# Patient Record
Sex: Female | Born: 1965 | Race: White | Hispanic: No | Marital: Married | Smoking: Current every day smoker
Health system: Southern US, Community
[De-identification: ages and names within clinical notes are randomized; demographics above are authoritative.]

---

## 2005-01-30 ENCOUNTER — Ambulatory Visit: Payer: Self-pay | Admitting: General Practice

## 2005-02-19 ENCOUNTER — Ambulatory Visit: Payer: Self-pay | Admitting: General Practice

## 2005-09-04 ENCOUNTER — Emergency Department: Payer: Self-pay | Admitting: Emergency Medicine

## 2006-02-14 ENCOUNTER — Ambulatory Visit: Payer: Self-pay | Admitting: Family Medicine

## 2006-03-04 ENCOUNTER — Emergency Department: Payer: Self-pay | Admitting: Emergency Medicine

## 2006-03-05 ENCOUNTER — Ambulatory Visit: Payer: Self-pay | Admitting: Emergency Medicine

## 2006-05-29 ENCOUNTER — Ambulatory Visit: Payer: Self-pay | Admitting: Family Medicine

## 2007-10-25 IMAGING — CR LEFT INDEX FINGER 2+V
1 series · 3 of 3 positions shown · non-contrast
Comparison: none

REASON FOR EXAM: Trauma, swelling
COMMENTS:

[Series 1: view not recorded · 0.17mm/px · 3 of 3 slices shown]
[im 1/3]
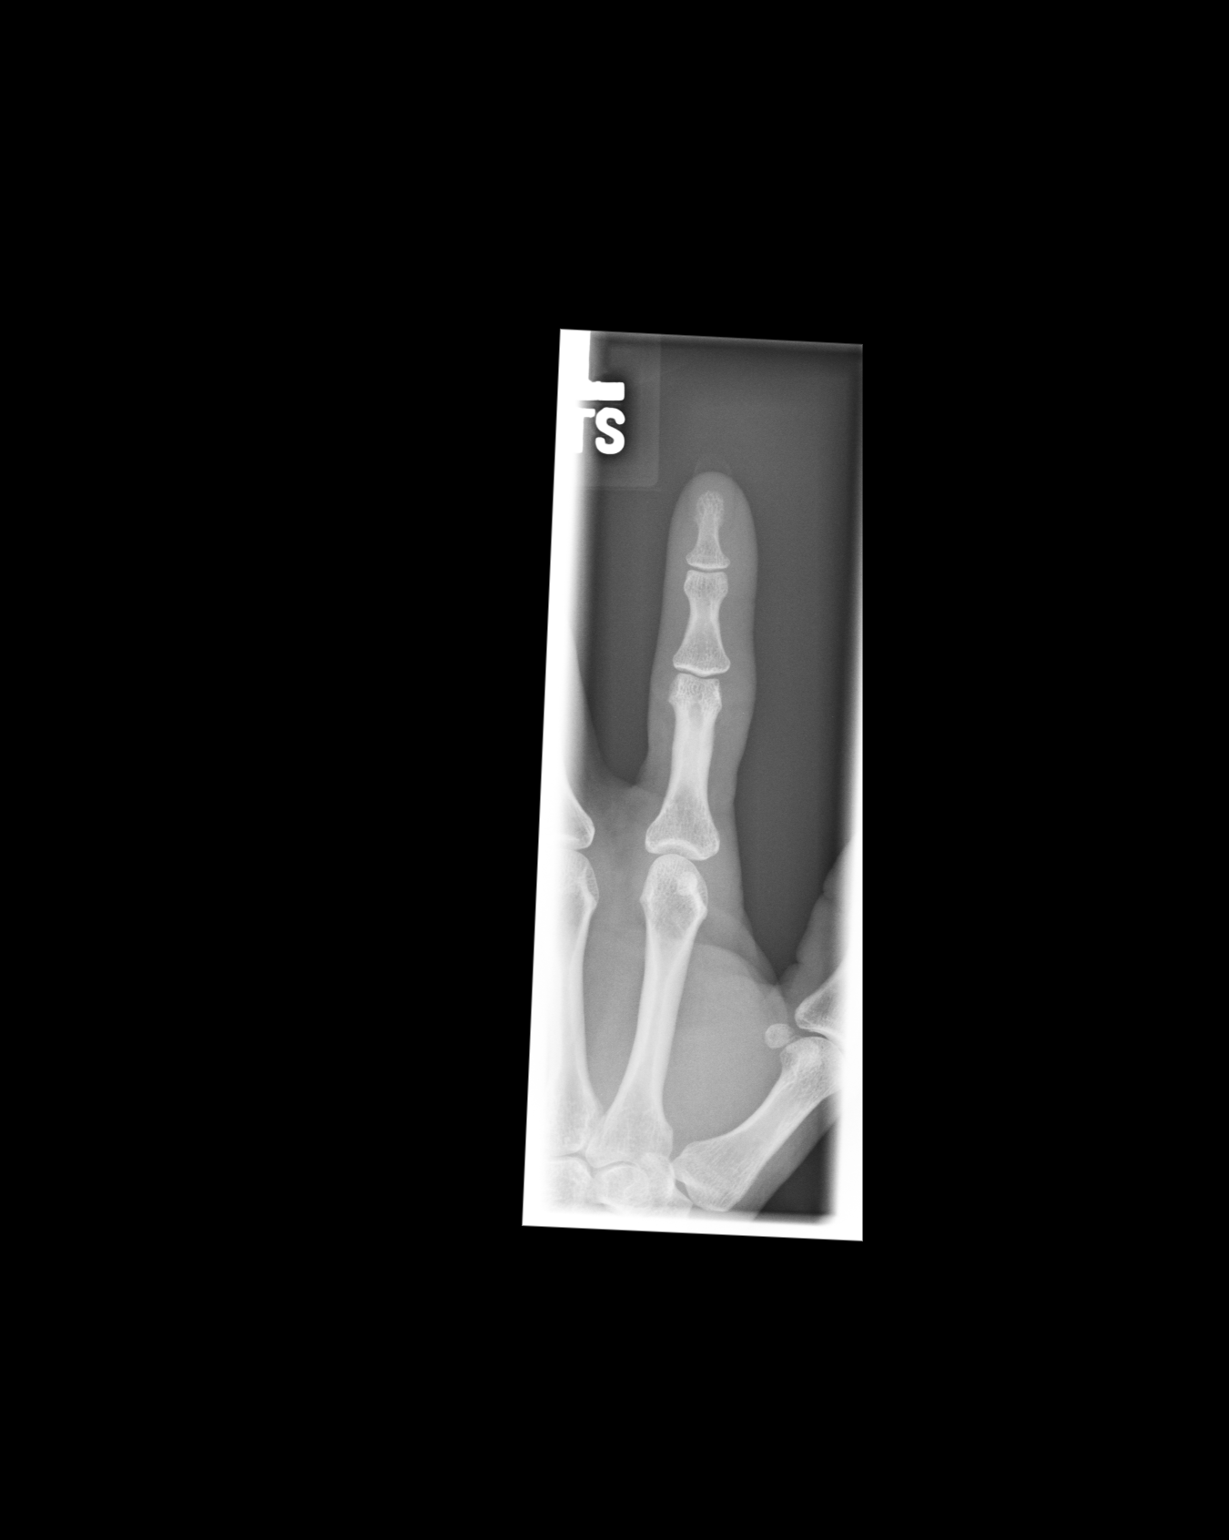
[im 2/3]
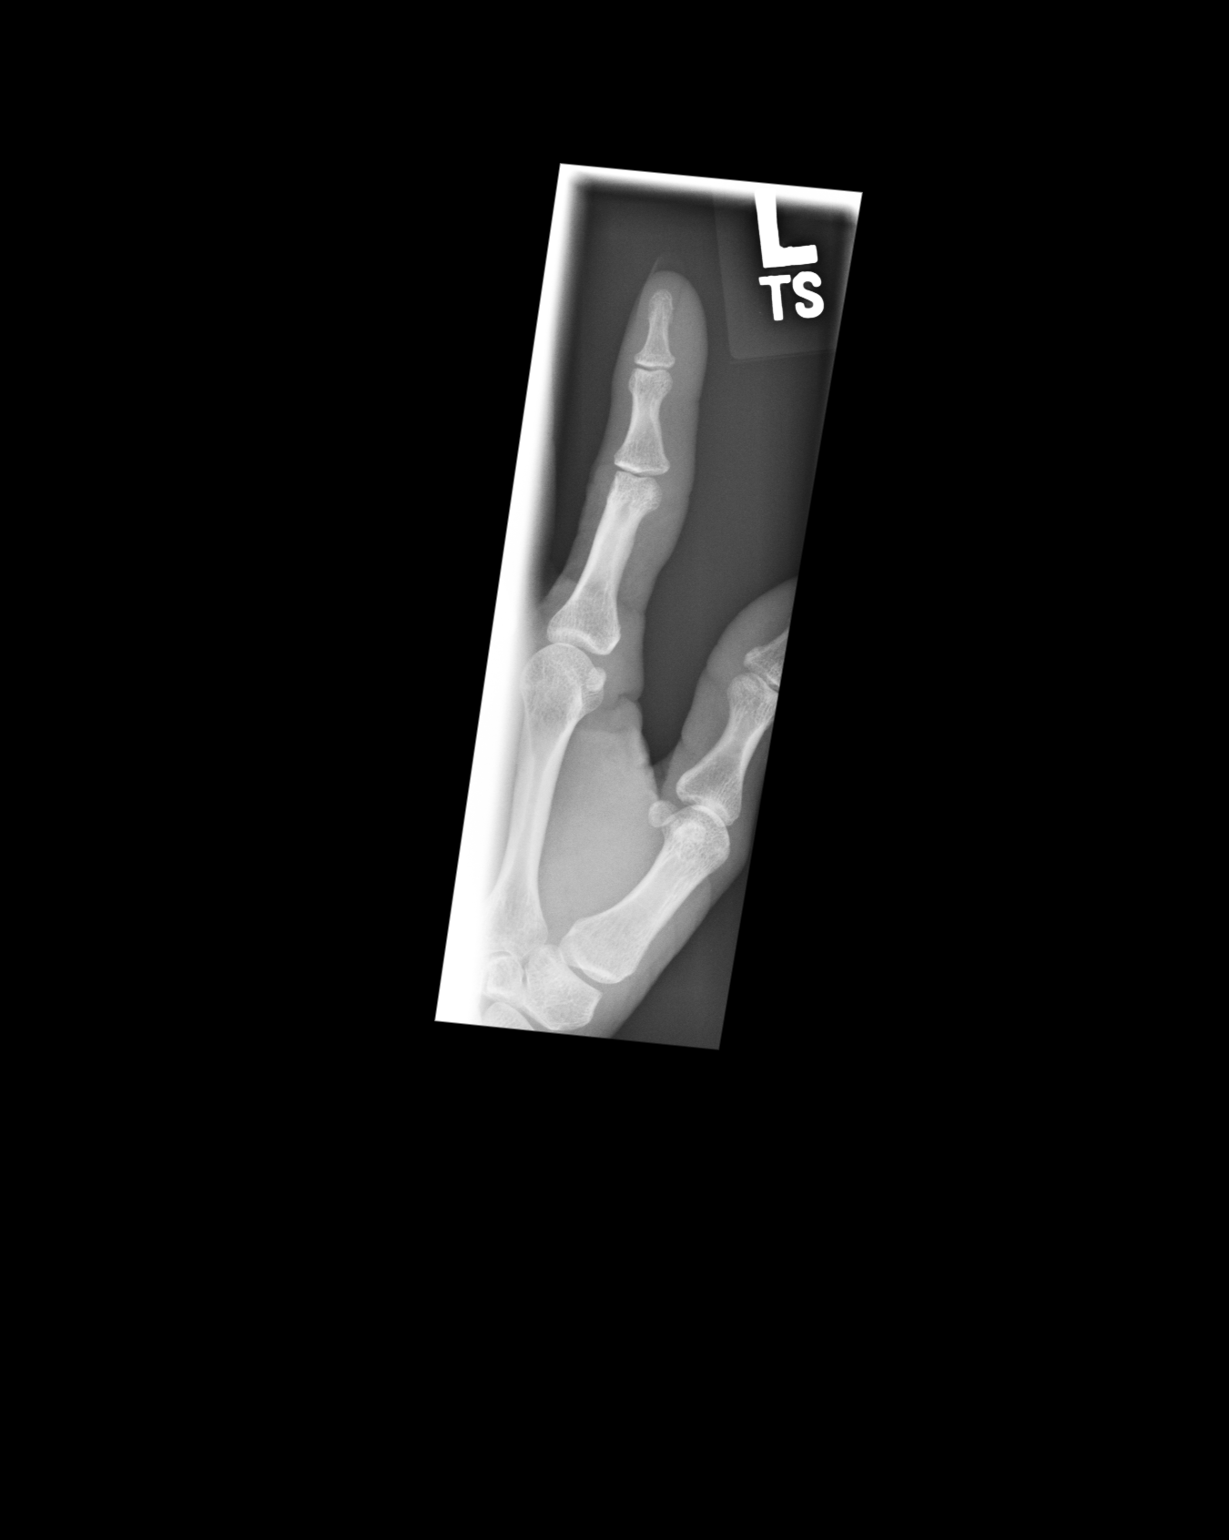
[im 3/3]
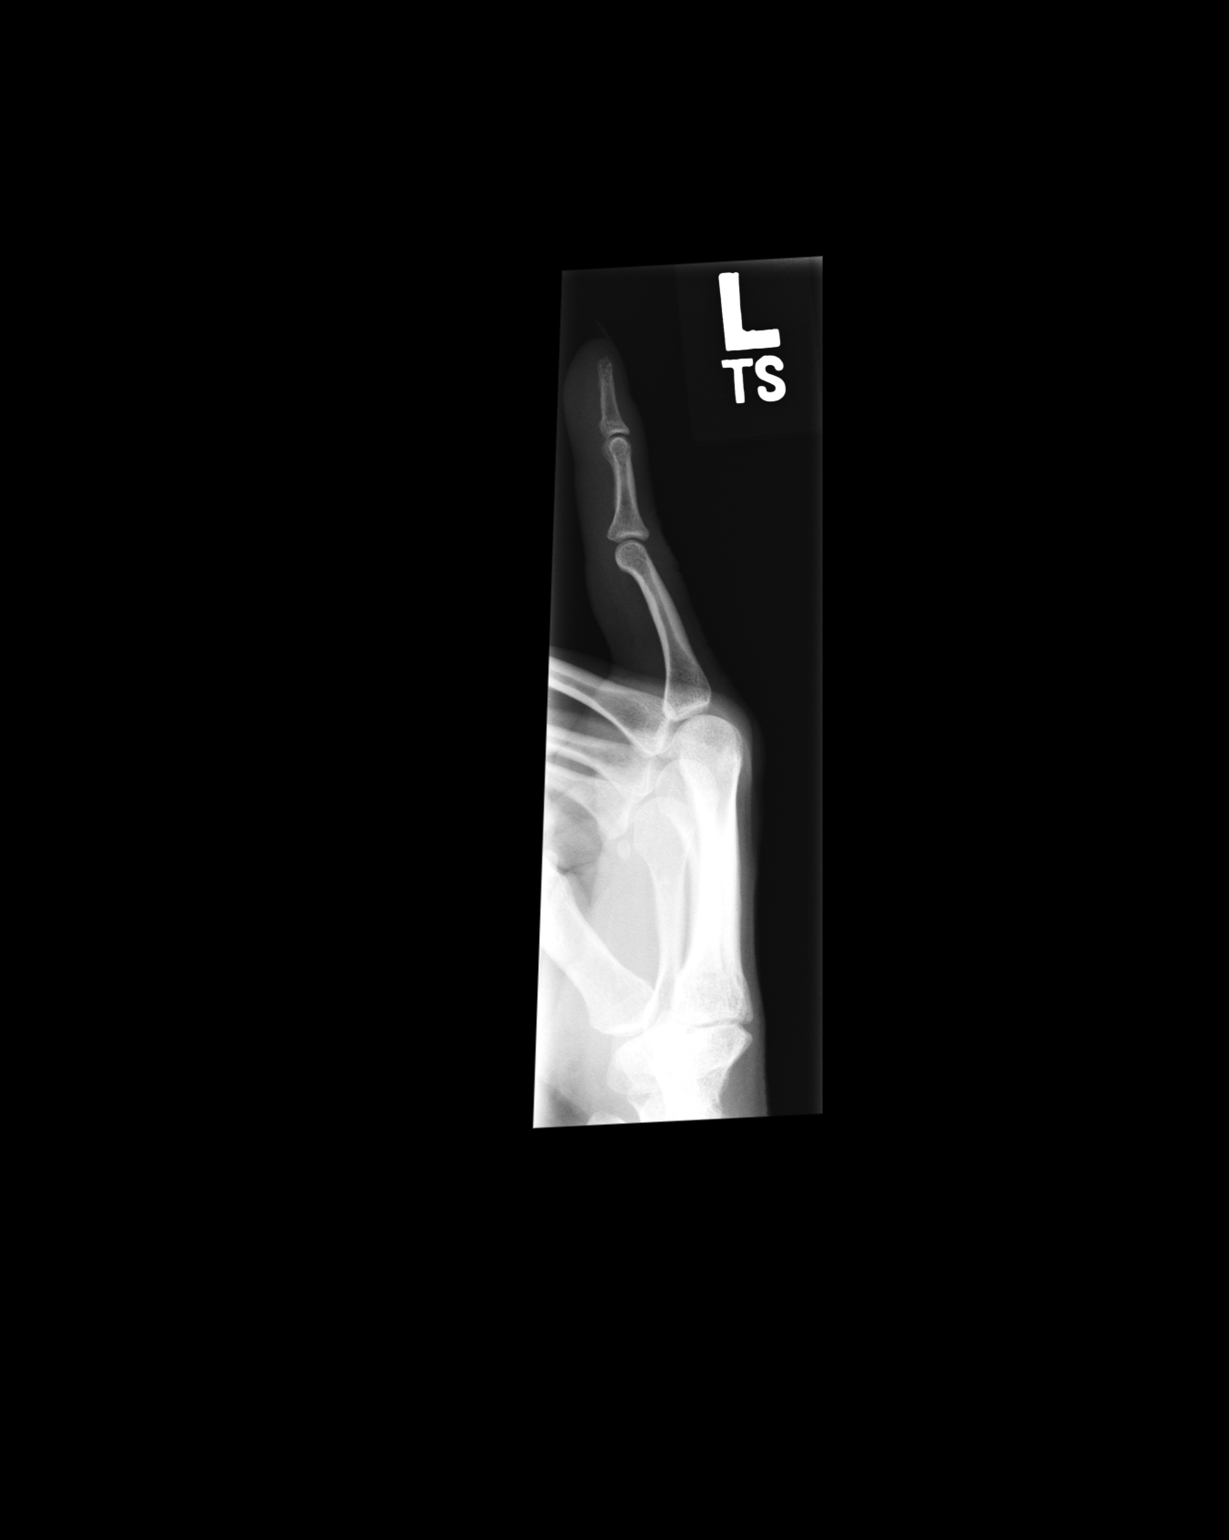

[3 of 3 positions shown; findings below may reference images not displayed]

PROCEDURE:     DXR - DXR FINGER INDEX 2ND DIGIT LT HA  - September 04, 2005 [DATE]

RESULT:     The patient sustained a crush-type injury.

There is soft tissue swelling especially over the PIP joint region.  I do
not see evidence of an acute fracture.  The interphalangeal joints are
normal in appearance.
IMPRESSION: I do not see evidence of acute bony abnormality of the LEFT index finger.
There is soft tissue swelling.

## 2010-10-17 ENCOUNTER — Emergency Department: Payer: Self-pay | Admitting: Emergency Medicine

## 2011-07-04 ENCOUNTER — Emergency Department: Payer: Self-pay | Admitting: Unknown Physician Specialty

## 2013-05-24 ENCOUNTER — Emergency Department: Payer: Self-pay

## 2014-10-07 ENCOUNTER — Emergency Department
Admission: EM | Admit: 2014-10-07 | Discharge: 2014-10-08 | Discharge status: Against Medical Advice | Payer: Self-pay | Attending: Emergency Medicine | Admitting: Emergency Medicine

## 2014-10-07 DIAGNOSIS — M545 Low back pain: Secondary | ICD-10-CM | POA: Insufficient documentation

## 2014-10-07 NOTE — ED Notes (Signed)
Pt in with left lower back  Pain since Sunday, states not better. Pain is worse when she walks or moves.

## 2020-02-10 ENCOUNTER — Ambulatory Visit (HOSPITAL_COMMUNITY)
Admission: RE | Admit: 2020-02-10 | Discharge: 2020-02-10 | Disposition: A | Discharge status: Home / Self Care | Payer: Self-pay | Source: Ambulatory Visit | Attending: Cardiology | Admitting: Cardiology

## 2020-02-10 ENCOUNTER — Other Ambulatory Visit (HOSPITAL_COMMUNITY): Payer: Self-pay | Admitting: Physician Assistant

## 2020-02-10 ENCOUNTER — Other Ambulatory Visit: Payer: Self-pay

## 2020-02-10 DIAGNOSIS — M79605 Pain in left leg: Secondary | ICD-10-CM

## 2020-02-10 NOTE — Progress Notes (Signed)
Left lower extremity venous duplex complete.  Please see CV proc tab for preliminary results. Levin Bacon- RDMS, RVT 11:38 AM  02/10/2020

## 2020-11-08 ENCOUNTER — Other Ambulatory Visit: Payer: Self-pay

## 2020-11-08 ENCOUNTER — Emergency Department
Admission: EM | Admit: 2020-11-08 | Discharge: 2020-11-08 | Disposition: A | Discharge status: Home / Self Care | Payer: Self-pay | Attending: Emergency Medicine | Admitting: Emergency Medicine

## 2020-11-08 ENCOUNTER — Emergency Department: Payer: Self-pay

## 2020-11-08 DIAGNOSIS — F17210 Nicotine dependence, cigarettes, uncomplicated: Secondary | ICD-10-CM | POA: Insufficient documentation

## 2020-11-08 DIAGNOSIS — U071 COVID-19: Secondary | ICD-10-CM | POA: Insufficient documentation

## 2020-11-08 NOTE — Discharge Instructions (Addendum)
Follow-up with your primary care provider if any continued problems or urgent care such as [facility] acute care if any continued problems.  Return to the emergency department if any severe worsening of your symptoms such as difficulty breathing or shortness of breath.  You may continue taking over-the-counter medication for your symptoms and drink lots of fluids to stay hydrated.  Discontinue smoking.

## 2020-11-08 NOTE — ED Notes (Signed)
See triage note  States she tested positive for COVID last Tuesday  conts' to have congestion and bilateral ear pain   Also has been having some SOB  Afebrile on arrival

## 2020-11-08 NOTE — ED Triage Notes (Signed)
Pt c/o cough, congestion, fever and bodyaches

## 2020-11-08 NOTE — ED Provider Notes (Signed)
[facility] Emergency Department Provider Note   ____________________________________________   Event Date/Time   First MD Initiated Contact with Patient 11/08/20 [id number]     (approximate)  I have reviewed the triage vital signs and the nursing notes.   HISTORY  Chief Complaint URI    HPI Brianna Solis is a 55 y.o. female sent to the ED with complaint of cough, congestion, fever and body aches.  Patient was exposed to her daughter who has COVID.  Patient tested COVID 7 days ago with a home test that was provided by [employer] where she works.  Patient is nonvaccinated.  Patient reports that she is a smoker.  Currently she is worried that she has pneumonia.  Currently she rates her discomfort as a 5 out of 10.       No past medical history on file.  There are no problems to display for this patient.   Prior to Admission medications   Not on File    Allergies Erythromycin  No family history on file.  Social History Social History   Tobacco Use   Smoking status: Every Day    Types: Cigarettes   Smokeless tobacco: Never  Substance Use Topics   Alcohol use: Not Currently   Drug use: Not Currently    Review of Systems Constitutional: Positive fever/chills Eyes: No visual changes. ENT: No sore throat.  Positive congestion. Cardiovascular: Denies chest pain. Respiratory: Denies shortness of breath.  Positive cough. Gastrointestinal: No abdominal pain.  No nausea, no vomiting.  No diarrhea.  No constipation. Musculoskeletal: Positive musculoskeletal pain. Skin: Negative for rash. Neurological: Negative for headaches, focal weakness or numbness. ____________________________________________   PHYSICAL EXAM:  VITAL SIGNS: ED Triage Vitals  Enc Vitals Group     BP 11/08/20 0800 119/60     Pulse Rate 11/08/20 0800 66     Resp 11/08/20 0800 18     Temp 11/08/20 0800 98.5 F (36.9 C)     Temp Source 11/08/20 0800 Oral     SpO2 11/08/20 0800  99 %     Weight 11/08/20 0801 106 lb 0.7 oz (48.1 kg)     Height 11/08/20 0801 4\' 11"  (1.499 m)     Head Circumference --      Peak Flow --      Pain Score 11/08/20 0800 5     Pain Loc --      Pain Edu? --      Excl. in [place]? --     Constitutional: Alert and oriented. Well appearing and in no acute distress. Eyes: Conjunctivae are normal.  Head: Atraumatic. Nose: No congestion/rhinnorhea. Mouth/Throat: Mucous membranes are moist.  Oropharynx non-erythematous. Neck: No stridor.   Cardiovascular: Normal rate, regular rhythm. Grossly normal heart sounds.  Good peripheral circulation. Respiratory: Normal respiratory effort.  No retractions. Lungs CTAB. Gastrointestinal: Soft and nontender. No distention.  Musculoskeletal: Moves upper and lower extremities that any difficulty.  Normal gait was noted. Neurologic:  Normal speech and language. No gross focal neurologic deficits are appreciated. No gait instability. Skin:  Skin is warm, dry and intact. No rash noted. Psychiatric: Mood and affect are normal. Speech and behavior are normal.  ____________________________________________   LABS (all labs ordered are listed, but only abnormal results are displayed)  Labs Reviewed - No data to display ____________________________________________  ____________________________________________  RADIOLOGY 11/10/20, personally viewed and evaluated these images (plain radiographs) as part of my medical decision making, as well as reviewing the  written report by the radiologist.   Official radiology report(s): DG Chest Port 1 View  Result Date: 11/08/2020 CLINICAL DATA:  Cough.  History of COVID. EXAM: PORTABLE CHEST 1 VIEW COMPARISON:  No recent. FINDINGS: Mediastinum and hilar structures normal. Heart size normal. No focal infiltrate. No pleural effusion or pneumothorax. Mild thoracic spine scoliosis. IMPRESSION: No acute cardiopulmonary disease. Electronically Signed   By: Maisie Fus   Register   On: 11/08/2020 08:42    ____________________________________________   PROCEDURES  Procedure(s) performed (including Critical Care):  Procedures   ____________________________________________   INITIAL IMPRESSION / ASSESSMENT AND PLAN / ED COURSE  As part of my medical decision making, I reviewed the following data within the electronic MEDICAL RECORD NUMBER Notes from prior ED visits and [place] Controlled Substance Database  55 year old female presents to the ED with concerns of having pneumonia.  Patient states that she tested positive for COVID 7 days ago with a home test that was given to her by [employer].  Patient works for [employer] and continues to work at home.  Today she is worried about having pneumonia.  Patient is nonvaccinated and does smoke daily.  O2 sat was 99% and patient is afebrile today.  Chest x-ray was negative for pneumonia or cardiopulmonary disease.  Patient was made aware and also that she should continue and quarantine at home as she has not been vaccinated and the recommendations from the CDC is still 10 days.  Patient states that she will comply.  A note was written for her today for work and she will continue working from home.  She is encouraged to return to the emergency department if any severe worsening of her symptoms or difficulty breathing.   ____________________________________________   FINAL CLINICAL IMPRESSION(S) / ED DIAGNOSES  Final diagnoses:  COVID-19     ED Discharge Orders     None        Note:  This document was prepared using Dragon voice recognition software and may include unintentional dictation errors.    Tommi Rumps, PA-C 11/08/20 2458    Sharyn Creamer, MD 11/08/20 [phone]

## 2022-06-17 ENCOUNTER — Other Ambulatory Visit: Payer: Self-pay

## 2022-06-17 ENCOUNTER — Emergency Department
Admission: EM | Admit: 2022-06-17 | Discharge: 2022-06-17 | Disposition: A | Discharge status: Home / Self Care | Payer: Self-pay | Attending: Emergency Medicine | Admitting: Emergency Medicine

## 2022-06-17 ENCOUNTER — Emergency Department: Payer: Self-pay

## 2022-06-17 DIAGNOSIS — J069 Acute upper respiratory infection, unspecified: Secondary | ICD-10-CM | POA: Insufficient documentation

## 2022-06-17 DIAGNOSIS — Z1152 Encounter for screening for COVID-19: Secondary | ICD-10-CM | POA: Insufficient documentation

## 2022-06-17 LAB — CBC WITH DIFFERENTIAL/PLATELET
Abs Immature Granulocytes: 0.01 10*3/uL (ref 0.00–0.07)
Basophils Absolute: 0.1 10*3/uL (ref 0.0–0.1)
Basophils Relative: 1 %
Eosinophils Absolute: 0.2 10*3/uL (ref 0.0–0.5)
Eosinophils Relative: 3 %
HCT: 40.7 % (ref 36.0–46.0)
Hemoglobin: 13.5 g/dL (ref 12.0–15.0)
Immature Granulocytes: 0 %
Lymphocytes Relative: 48 %
Lymphs Abs: 3.8 10*3/uL (ref 0.7–4.0)
MCH: 31.2 pg (ref 26.0–34.0)
MCHC: 33.2 g/dL (ref 30.0–36.0)
MCV: 94 fL (ref 80.0–100.0)
Monocytes Absolute: 0.6 10*3/uL (ref 0.1–1.0)
Monocytes Relative: 8 %
Neutro Abs: 3.2 10*3/uL (ref 1.7–7.7)
Neutrophils Relative %: 40 %
Platelets: 280 10*3/uL (ref 150–400)
RBC: 4.33 MIL/uL (ref 3.87–5.11)
RDW: 14 % (ref 11.5–15.5)
WBC: 7.9 10*3/uL (ref 4.0–10.5)
nRBC: 0 % (ref 0.0–0.2)

## 2022-06-17 LAB — RESP PANEL BY RT-PCR (RSV, FLU A&B, COVID)  RVPGX2
Influenza A by PCR: NEGATIVE
Influenza B by PCR: NEGATIVE
Resp Syncytial Virus by PCR: NEGATIVE
SARS Coronavirus 2 by RT PCR: NEGATIVE

## 2022-06-17 LAB — BASIC METABOLIC PANEL
Anion gap: 8 (ref 5–15)
BUN: 10 mg/dL (ref 6–20)
CO2: 24 mmol/L (ref 22–32)
Calcium: 9.1 mg/dL (ref 8.9–10.3)
Chloride: 105 mmol/L (ref 98–111)
Creatinine, Ser: 0.7 mg/dL (ref 0.44–1.00)
GFR, Estimated: >60 mL/min (ref >60)
Glucose, Bld: 106 mg/dL — ABNORMAL HIGH (ref 70–99)
Potassium: 3.8 mmol/L (ref 3.5–5.1)
Sodium: 137 mmol/L (ref 135–145)

## 2022-06-17 NOTE — ED Provider Notes (Signed)
   [facility] Provider Note    Event Date/Time   First MD Initiated Contact with Patient 06/17/22 1546     (approximate)  History   Chief Complaint: Nasal Congestion (Patient presents with nasal congestion, runny nose, chills, and post-nasal drip that has been going on for a few days; She also has been having dizziness with position changes)  HPI  Brianna Solis is a 57 y.o. female with no significant past medical history presents to the emergency department for evaluation.  Patient states she had to leave work today due to nasal congestion.  She states for the past 2 to 3 days she has had nasal and ear congestion, slight sore throat and occasional dry cough.  Patient denies any chest pain.  Denies any shortness of breath.  No abdominal pain nausea vomiting or diarrhea.  Physical Exam   Triage Vital Signs: ED Triage Vitals  Enc Vitals Group     BP 06/17/22 1523 (!) 143/89     Pulse Rate 06/17/22 1523 77     Resp 06/17/22 1523 19     Temp 06/17/22 1523 98.2 F (36.8 C)     Temp Source 06/17/22 1523 Oral     SpO2 06/17/22 1523 99 %     Weight 06/17/22 1524 108 lb (49 kg)     Height 06/17/22 1524 4' 10.5" (1.486 m)     Head Circumference --      Peak Flow --      Pain Score --      Pain Loc --      Pain Edu? --      Excl. in [place]? --     Most recent vital signs: Vitals:   06/17/22 1523  BP: (!) 143/89  Pulse: 77  Resp: 19  Temp: 98.2 F (36.8 C)  SpO2: 99%    General: Awake, no distress.  CV:  Good peripheral perfusion.  Regular rate and rhythm  Resp:  Normal effort.  Equal breath sounds bilaterally.  Abd:  No distention.  Soft, nontender.  No rebound or guarding. Other:  Mild nasal congestion.  Normal tympanic membranes.  Slight pharyngeal erythema without exudates or tonsillar hypertrophy.   ED Results / Procedures / Treatments   RADIOLOGY  I have reviewed and interpreted the chest x-ray images.  No consolidation seen on my  evaluation.   MEDICATIONS ORDERED IN ED: Medications - No data to display   IMPRESSION / MDM / [place] / ED COURSE  I reviewed the triage vital signs and the nursing notes.  Patient's presentation is most consistent with acute presentation with potential threat to life or bodily function.  Patient presents emergency department for several days of congestion, sore throat, slight cough subjective low-grade temperature.  Overall the patient appears well, no distress.  Clear lung sounds.  Chest x-ray appears clear.  Will check labs including COVID/flu/RSV we will continue to closely monitor.  Differential would include upper respiratory infection, pharyngitis, viral syndrome such as COVID or influenza.  Patient's COVID/flu/RSV is negative, CBC is normal, chemistry is normal, chest x-ray is clear.  Suspect likely viral syndrome.  Have the patient follow-up with her doctor.  Discussed supportive care.  Patient agreeable to plan  FINAL CLINICAL IMPRESSION(S) / ED DIAGNOSES   Upper respiratory infection   Note:  This document was prepared using Dragon voice recognition software and may include unintentional dictation errors.   Harvest Dark, MD 06/17/22 [phone]

## 2022-06-17 NOTE — ED Triage Notes (Signed)
Patient presents with nasal congestion, runny nose, chills, and post-nasal drip that has been going on for a few days; She also has been having dizziness with position changes

## 2022-06-17 NOTE — ED Notes (Signed)
RN to bedside to room pt and introduce self to pt. Pt seated and in no acute distress.

## 2022-12-29 IMAGING — DX DG CHEST 1V PORT
1 series · 1 of 1 positions shown · non-contrast
Comparison: No recent.

CLINICAL DATA: Cough.  History of COVID.

EXAM:
PORTABLE CHEST 1 VIEW

[chest ap]
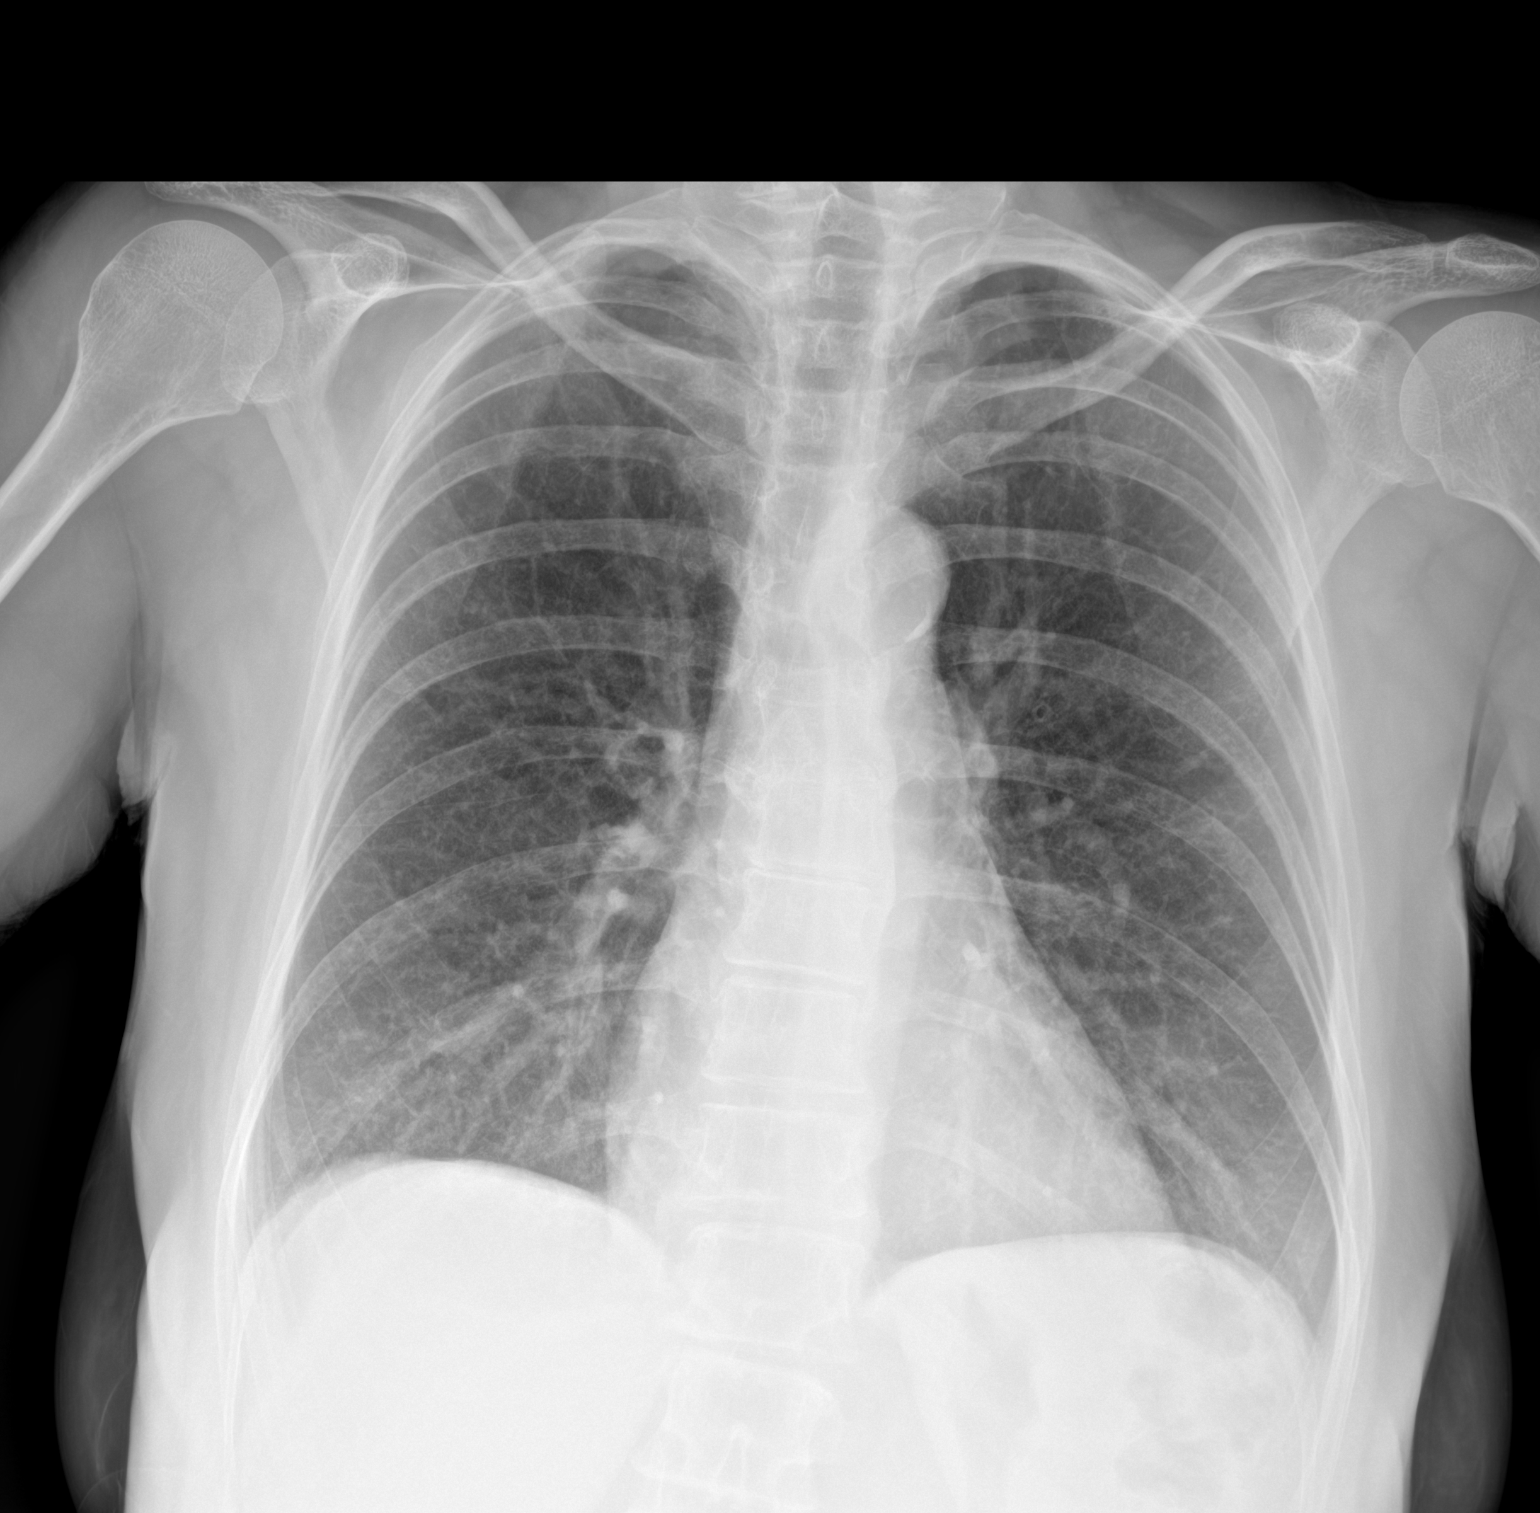

[1 of 1 positions shown; findings below may reference images not displayed]

FINDINGS: Mediastinum and hilar structures normal. Heart size normal. No focal
infiltrate. No pleural effusion or pneumothorax. Mild thoracic spine
scoliosis.
IMPRESSION: No acute cardiopulmonary disease.

## 2023-11-11 ENCOUNTER — Emergency Department
Admission: EM | Admit: 2023-11-11 | Discharge: 2023-11-11 | Disposition: A | Discharge status: Home / Self Care | Payer: Self-pay

## 2023-11-11 ENCOUNTER — Other Ambulatory Visit: Payer: Self-pay

## 2023-11-11 ENCOUNTER — Emergency Department: Payer: Self-pay

## 2023-11-11 DIAGNOSIS — W208XXA Other cause of strike by thrown, projected or falling object, initial encounter: Secondary | ICD-10-CM | POA: Insufficient documentation

## 2023-11-11 DIAGNOSIS — S9031XA Contusion of right foot, initial encounter: Secondary | ICD-10-CM | POA: Insufficient documentation

## 2023-11-11 NOTE — ED Provider Notes (Signed)
 [facility] Provider Note    Event Date/Time   First MD Initiated Contact with Patient 11/11/23 1911     (approximate)   History   Foot Injury   HPI  Brianna Solis is a 58 y.o. female with past medical history of prior provoked DVT who presents with 16 hours of right foot pain.  Patient was in her usual state of health and was assembling a wooden cabinet when she dropped a piece on her right foot.  She instantly had pain over the dorsum of her foot but has been able to ambulate.  The pain has not gone away and today she has noticed some bruising over the dorsum of her foot decided to obtain an x-ray to ensure nothing was broken.  This is her only complaint today.  She denies any sensation changes in her foot      Physical Exam   Triage Vital Signs: ED Triage Vitals  Encounter Vitals Group     BP 11/11/23 1758 114/87     Girls Systolic BP Percentile --      Girls Diastolic BP Percentile --      Boys Systolic BP Percentile --      Boys Diastolic BP Percentile --      Pulse Rate 11/11/23 1758 87     Resp 11/11/23 1758 18     Temp 11/11/23 1758 99.1 F (37.3 C)     Temp Source 11/11/23 1758 Oral     SpO2 11/11/23 1758 98 %     Weight --      Height --      Head Circumference --      Peak Flow --      Pain Score 11/11/23 1759 8     Pain Loc --      Pain Education --      Exclude from Growth Chart --     Most recent vital signs: Vitals:   11/11/23 1758  BP: 114/87  Pulse: 87  Resp: 18  Temp: 99.1 F (37.3 C)  SpO2: 98%    Nursing Triage Note reviewed. Vital signs reviewed and patients oxygen saturation is normoxic  General: Patient is well nourished, well developed, awake and alert, resting comfortably in no acute distress Appears well-perfused no respiratory distress She is able to ambulate throughout the emergency department Toes with less than 2-second cap refill in all digits, 2+ DP PT pulses, some ecchymosis over dorsum of foot,  full range of motion of ankle nontender over ankle or distal tibia-fibula.  She is mildly tender over metatarsals  ED Results / Procedures / Treatments   Labs (all labs ordered are listed, but only abnormal results are displayed) Labs Reviewed - No data to display   EKG None  RADIOLOGY Xray of foot: No acute fracture on my independent review and interpretation and radiologist agrees    PROCEDURES:  Critical Care performed: No  Procedures   MEDICATIONS ORDERED IN ED: Medications - No data to display   IMPRESSION / MDM / ASSESSMENT AND PLAN / ED COURSE                                Differential diagnosis includes, but is not limited to, metatarsal fracture, contusion of foot   ED course: Patient is neurovascularly intact and lower extremity.  She is only here for evaluation of the foot after traumatic injury.  X-ray demonstrated  no fracture.  Patient was reassured.  She was advised to continue ice packs to the area and alternate between over-the-counter ibuprofen and Tylenol as directed.  She was also counseled that it may take up to 10 days for very small nondisplaced fractures to appear in the foot if she continues to have pain after 10 days to seek repeat imaging and she voiced understanding  At time of discharge there is no evidence of acute life, limb, vision, or fertility threat. Patient has stable vital signs, pain is well controlled, patient is ambulatory and p.o. tolerant.  Discharge instructions were completed using the [employer] system. I would refer you to those at this time. All warnings prescriptions follow-up etc. were discussed in detail with the patient. Patient indicates understanding and is agreeable with this plan. All questions answered.  Patient is made aware that they may return to the emergency department for any worsening or new condition or for any other emergency.    Clinical Course as of 11/12/23 0007  Mon Nov 11, 2023  1916 DG Foot 2 Views  Right No fracture on my independent review and interpretation radiologist agrees [HD]    Clinical Course User Index [HD] Nicholaus Rolland BRAVO, MD     FINAL CLINICAL IMPRESSION(S) / ED DIAGNOSES   Final diagnoses:  Contusion of right foot, initial encounter     Rx / DC Orders   ED Discharge Orders     None        Note:  This document was prepared using Dragon voice recognition software and may include unintentional dictation errors.   Nicholaus Rolland BRAVO, MD 11/12/23 [phone]

## 2023-11-11 NOTE — Discharge Instructions (Addendum)
 You were seen in the emergency department with foot pain after a cabinet fell.  There was no fracture on x-ray.  Please elevate the extremity use ibuprofen and Tylenol as directed for pain.  Use ice packs.  Very small nondisplaced fractures can take up to 10 days to obtain an x-ray so if you continue to have pain after 10 days get repeat imaging.  Please return with any acutely worsening symptoms or any other emergency. -- RETURN PRECAUTIONS & AFTERCARE: (ENGLISH) RETURN PRECAUTIONS: Return immediately to the emergency department or see/call your doctor if you feel worse, weak or have changes in speech or vision, are short of breath, have fever, vomiting, pain, bleeding or dark stool, trouble urinating or any new issues. Return here or see/call your doctor if not improving as expected for your suspected condition. FOLLOW-UP CARE: Call your doctor and/or any doctors we referred you to for more advice and to make an appointment. Do this today, tomorrow or after the weekend. Some doctors only take PPO insurance so if you have HMO insurance you may want to contact your HMO or your regular doctor for referral to a specialist within your plan. Either way tell the doctor's office that it was a referral from the emergency department so you get the soonest possible appointment.  YOUR TEST RESULTS: Take result reports of any blood or urine tests, imaging tests and EKG's to your doctor and any referral doctor. Have any abnormal tests repeated. Your doctor or a referral doctor can let you know when this should be done. Also make sure your doctor contacts this hospital to get any test results that are not currently available such as cultures or special tests for infection and final imaging reports, which are often not available at the time you leave the ER but which may list additional important findings that are not documented on the preliminary report. BLOOD PRESSURE: If your blood pressure was greater than 120/80 have  your blood pressure rechecked within 1 to 2 weeks. MEDICATION SIDE EFFECTS: Do not drive, walk, bike, take the bus, etc. if you have received or are being prescribed any sedating medications such as those for pain or anxiety or certain antihistamines like Benadryl. If you have been give one of these here get a taxi home or have a friend drive you home. Ask your pharmacist to counsel you on potential side effects of any new medication

## 2023-11-11 NOTE — ED Triage Notes (Signed)
 Was putting together a cabinet and the door fell on R foot. States it is painful and swollen. Foot is tender to  bear weight. Wants to see if foot is fractured. No Deformity noted

## 2023-11-11 NOTE — ED Notes (Signed)
 Pt discharged by ED Provider, Nicholaus, MD and directed to front desk to stop by registration.
